# Patient Record
Sex: Male | Born: 2000 | Race: Black or African American | Hispanic: No | Marital: Single | Smoking: Never smoker
Health system: Southern US, Community
[De-identification: ages and names within clinical notes are randomized; demographics above are authoritative.]

## PROBLEM LIST (undated history)

## (undated) DIAGNOSIS — N200 Calculus of kidney: Secondary | ICD-10-CM

## (undated) DIAGNOSIS — F988 Other specified behavioral and emotional disorders with onset usually occurring in childhood and adolescence: Secondary | ICD-10-CM

---
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
## 2020-11-25 DIAGNOSIS — L301 Dyshidrosis [pompholyx]: Secondary | ICD-10-CM | POA: Insufficient documentation

## 2020-11-25 DIAGNOSIS — Z87442 Personal history of urinary calculi: Secondary | ICD-10-CM | POA: Insufficient documentation

## 2021-09-06 ENCOUNTER — Ambulatory Visit (INDEPENDENT_AMBULATORY_CARE_PROVIDER_SITE_OTHER): Payer: No Typology Code available for payment source | Admitting: Physician Assistant

## 2021-09-06 ENCOUNTER — Other Ambulatory Visit: Payer: Self-pay

## 2021-09-06 ENCOUNTER — Encounter: Payer: Self-pay | Admitting: Physician Assistant

## 2021-09-06 VITALS — BP 138/64 | HR 79 | Ht 72.5 in | Wt 186.0 lb

## 2021-09-06 DIAGNOSIS — R109 Unspecified abdominal pain: Secondary | ICD-10-CM

## 2021-09-06 DIAGNOSIS — Z7689 Persons encountering health services in other specified circumstances: Secondary | ICD-10-CM

## 2021-09-06 DIAGNOSIS — Z0000 Encounter for general adult medical examination without abnormal findings: Secondary | ICD-10-CM

## 2021-09-06 DIAGNOSIS — Z13220 Encounter for screening for lipoid disorders: Secondary | ICD-10-CM

## 2021-09-06 DIAGNOSIS — Z113 Encounter for screening for infections with a predominantly sexual mode of transmission: Secondary | ICD-10-CM

## 2021-09-06 DIAGNOSIS — Z1159 Encounter for screening for other viral diseases: Secondary | ICD-10-CM | POA: Diagnosis not present

## 2021-09-06 DIAGNOSIS — Z13228 Encounter for screening for other metabolic disorders: Secondary | ICD-10-CM

## 2021-09-06 DIAGNOSIS — Z114 Encounter for screening for human immunodeficiency virus [HIV]: Secondary | ICD-10-CM

## 2021-09-06 DIAGNOSIS — Z87442 Personal history of urinary calculi: Secondary | ICD-10-CM

## 2021-09-06 NOTE — Patient Instructions (Signed)
Made referral to urology Get labs  Health Maintenance, Male Adopting a healthy lifestyle and getting preventive care are important in promoting health and wellness. Ask your health care provider about: The right schedule for you to have regular tests and exams. Things you can do on your own to prevent diseases and keep yourself healthy. What should I know about diet, weight, and exercise? Eat a healthy diet  Eat a diet that includes plenty of vegetables, fruits, low-fat dairy products, and lean protein. Do not eat a lot of foods that are high in solid fats, added sugars, or sodium. Maintain a healthy weight Body mass index (BMI) is a measurement that can be used to identify possible weight problems. It estimates body fat based on height and weight. Your health care provider can help determine your BMI and help you achieve or maintain a healthy weight. Get regular exercise Get regular exercise. This is one of the most important things you can do for your health. Most adults should: Exercise for at least 150 minutes each week. The exercise should increase your heart rate and make you sweat (moderate-intensity exercise). Do strengthening exercises at least twice a week. This is in addition to the moderate-intensity exercise. Spend less time sitting. Even light physical activity can be beneficial. Watch cholesterol and blood lipids Have your blood tested for lipids and cholesterol at 21 years of age, then have this test every 5 years. You may need to have your cholesterol levels checked more often if: Your lipid or cholesterol levels are high. You are older than 21 years of age. You are at high risk for heart disease. What should I know about cancer screening? Many types of cancers can be detected early and may often be prevented. Depending on your health history and family history, you may need to have cancer screening at various ages. This may include screening for: Colorectal  cancer. Prostate cancer. Skin cancer. Lung cancer. What should I know about heart disease, diabetes, and high blood pressure? Blood pressure and heart disease High blood pressure causes heart disease and increases the risk of stroke. This is more likely to develop in people who have high blood pressure readings or are overweight. Talk with your health care provider about your target blood pressure readings. Have your blood pressure checked: Every 3-5 years if you are 80-4 years of age. Every year if you are 40 years old or older. If you are between the ages of 69 and 70 and are a current or former smoker, ask your health care provider if you should have a one-time screening for abdominal aortic aneurysm (AAA). Diabetes Have regular diabetes screenings. This checks your fasting blood sugar level. Have the screening done: Once every three years after age 53 if you are at a normal weight and have a low risk for diabetes. More often and at a younger age if you are overweight or have a high risk for diabetes. What should I know about preventing infection? Hepatitis B If you have a higher risk for hepatitis B, you should be screened for this virus. Talk with your health care provider to find out if you are at risk for hepatitis B infection. Hepatitis C Blood testing is recommended for: Everyone born from [birth year] through 1966. Anyone with known risk factors for hepatitis C. Sexually transmitted infections (STIs) You should be screened each year for STIs, including gonorrhea and chlamydia, if: You are sexually active and are younger than 21 years of age. You are older  than 21 years of age and your health care provider tells you that you are at risk for this type of infection. Your sexual activity has changed since you were last screened, and you are at increased risk for chlamydia or gonorrhea. Ask your health care provider if you are at risk. Ask your health care provider about whether you are at  high risk for HIV. Your health care provider may recommend a prescription medicine to help prevent HIV infection. If you choose to take medicine to prevent HIV, you should first get tested for HIV. You should then be tested every 3 months for as long as you are taking the medicine. Follow these instructions at home: Alcohol use Do not drink alcohol if your health care provider tells you not to drink. If you drink alcohol: Limit how much you have to 0-2 drinks a day. Know how much alcohol is in your drink. In the [place] one drink equals one 12 oz bottle of beer (355 mL), one 5 oz glass of wine (148 mL), or one 1 oz glass of hard liquor (44 mL). Lifestyle Do not use any products that contain nicotine or tobacco. These products include cigarettes, chewing tobacco, and vaping devices, such as e-cigarettes. If you need help quitting, ask your health care provider. Do not use street drugs. Do not share needles. Ask your health care provider for help if you need support or information about quitting drugs. General instructions Schedule regular health, dental, and eye exams. Stay current with your vaccines. Tell your health care provider if: You often feel depressed. You have ever been abused or do not feel safe at home. Summary Adopting a healthy lifestyle and getting preventive care are important in promoting health and wellness. Follow your health care provider's instructions about healthy diet, exercising, and getting tested or screened for diseases. Follow your health care provider's instructions on monitoring your cholesterol and blood pressure. This information is not intended to replace advice given to you by your health care provider. Make sure you discuss any questions you have with your health care provider. Document Revised: 02/13/2021 Document Reviewed: 02/13/2021 Elsevier Patient Education  2022 [employer].

## 2021-09-06 NOTE — Progress Notes (Signed)
   Subjective:    Patient ID: Kyle Dalton, male    DOB: 29-Oct-2000, 20 y.o.   MRN: [id number]  HPI    Review of Systems     Objective:   Physical Exam        Assessment & Plan:

## 2021-09-07 LAB — COMPLETE METABOLIC PANEL WITH GFR
AG Ratio: 1.4 (calc) (ref 1.0–2.5)
ALT: 14 U/L (ref 9–46)
AST: 18 U/L (ref 10–40)
Albumin: 4.9 g/dL (ref 3.6–5.1)
Alkaline phosphatase (APISO): 77 U/L (ref 36–130)
BUN: 10 mg/dL (ref 7–25)
CO2: 25 mmol/L (ref 20–32)
Calcium: 10.2 mg/dL (ref 8.6–10.3)
Chloride: 99 mmol/L (ref 98–110)
Creat: 0.85 mg/dL (ref 0.60–1.24)
Globulin: 3.6 g/dL (calc) (ref 1.9–3.7)
Glucose, Bld: 76 mg/dL (ref 65–99)
Potassium: 4 mmol/L (ref 3.5–5.3)
Sodium: 137 mmol/L (ref 135–146)
Total Bilirubin: 1.2 mg/dL (ref 0.2–1.2)
Total Protein: 8.5 g/dL — ABNORMAL HIGH (ref 6.1–8.1)
eGFR: 128 mL/min/{1.73_m2} (ref >=60)

## 2021-09-07 LAB — TSH: TSH: 2.57 mIU/L (ref 0.40–4.50)

## 2021-09-07 LAB — CBC WITH DIFFERENTIAL/PLATELET
Absolute Monocytes: 726 cells/uL (ref 200–950)
Basophils Absolute: 33 cells/uL (ref 0–200)
Basophils Relative: 0.5 %
Eosinophils Absolute: 33 cells/uL (ref 15–500)
Eosinophils Relative: 0.5 %
HCT: 47.2 % (ref 38.5–50.0)
Hemoglobin: 16 g/dL (ref 13.2–17.1)
Lymphs Abs: 1294 cells/uL (ref 850–3900)
MCH: 29.9 pg (ref 27.0–33.0)
MCHC: 33.9 g/dL (ref 32.0–36.0)
MCV: 88.1 fL (ref 80.0–100.0)
MPV: 11.4 fL (ref 7.5–12.5)
Monocytes Relative: 11 %
Neutro Abs: 4514 cells/uL (ref 1500–7800)
Neutrophils Relative %: 68.4 %
Platelets: 227 10*3/uL (ref 140–400)
RBC: 5.36 10*6/uL (ref 4.20–5.80)
RDW: 12.9 % (ref 11.0–15.0)
Total Lymphocyte: 19.6 %
WBC: 6.6 10*3/uL (ref 3.8–10.8)

## 2021-09-07 LAB — LIPID PANEL
Cholesterol: 220 mg/dL — ABNORMAL HIGH (ref <200)
HDL: 57 mg/dL (ref >=40)
LDL Cholesterol (Calc): 150 mg/dL (calc) — ABNORMAL HIGH
Non-HDL Cholesterol (Calc): 163 mg/dL (calc) — ABNORMAL HIGH (ref <130)
Total CHOL/HDL Ratio: 3.9 (calc) (ref <5.0)
Triglycerides: 44 mg/dL (ref <150)

## 2021-09-07 LAB — HIV ANTIBODY (ROUTINE TESTING W REFLEX): HIV 1&2 Ab, 4th Generation: NONREACTIVE

## 2021-09-07 LAB — RPR: RPR Ser Ql: NONREACTIVE

## 2021-09-07 LAB — HEPATITIS C ANTIBODY
Hepatitis C Ab: NONREACTIVE
SIGNAL TO CUT-OFF: 0.04 (ref <1.00)

## 2021-09-08 ENCOUNTER — Other Ambulatory Visit: Payer: Self-pay

## 2021-09-08 ENCOUNTER — Ambulatory Visit (INDEPENDENT_AMBULATORY_CARE_PROVIDER_SITE_OTHER): Payer: No Typology Code available for payment source | Admitting: Physician Assistant

## 2021-09-08 ENCOUNTER — Encounter: Payer: Self-pay | Admitting: Physician Assistant

## 2021-09-08 ENCOUNTER — Other Ambulatory Visit (HOSPITAL_BASED_OUTPATIENT_CLINIC_OR_DEPARTMENT_OTHER): Payer: Self-pay

## 2021-09-08 ENCOUNTER — Ambulatory Visit (INDEPENDENT_AMBULATORY_CARE_PROVIDER_SITE_OTHER): Payer: No Typology Code available for payment source

## 2021-09-08 VITALS — BP 147/67 | HR 102 | Temp 97.6°F | Ht 72.5 in | Wt 188.0 lb

## 2021-09-08 DIAGNOSIS — Z87442 Personal history of urinary calculi: Secondary | ICD-10-CM

## 2021-09-08 DIAGNOSIS — R319 Hematuria, unspecified: Secondary | ICD-10-CM

## 2021-09-08 DIAGNOSIS — E7800 Pure hypercholesterolemia, unspecified: Secondary | ICD-10-CM | POA: Insufficient documentation

## 2021-09-08 DIAGNOSIS — R1032 Left lower quadrant pain: Secondary | ICD-10-CM

## 2021-09-08 DIAGNOSIS — R109 Unspecified abdominal pain: Secondary | ICD-10-CM

## 2021-09-08 DIAGNOSIS — K769 Liver disease, unspecified: Secondary | ICD-10-CM | POA: Insufficient documentation

## 2021-09-08 LAB — POCT URINALYSIS DIP (CLINITEK)
Bilirubin, UA: NEGATIVE
Glucose, UA: NEGATIVE mg/dL
Leukocytes, UA: NEGATIVE
Nitrite, UA: NEGATIVE
POC PROTEIN,UA: NEGATIVE
Spec Grav, UA: <=1.005 — AB (ref 1.010–1.025)
Urobilinogen, UA: 0.2 E.U./dL
pH, UA: 5.5 (ref 5.0–8.0)

## 2021-09-08 MED ORDER — TAMSULOSIN HCL 0.4 MG PO CAPS
0.4000 mg | ORAL_CAPSULE | Freq: Every day | ORAL | 1 refills | Status: DC
  Filled 2021-09-08: qty 30, 30d supply, fill #0

## 2021-09-08 MED ORDER — ONDANSETRON 4 MG PO TBDP
ORAL_TABLET | ORAL | 1 refills | Status: DC
Start: 2021-09-08 — End: 2022-06-18
  Filled 2021-09-08: qty 20, 7d supply, fill #0

## 2021-09-08 MED ORDER — KETOROLAC TROMETHAMINE 60 MG/2ML IM SOLN
60.0000 mg | Freq: Once | INTRAMUSCULAR | Status: AC
  Administered 2021-09-08: 60 mg via INTRAMUSCULAR

## 2021-09-08 MED ORDER — HYDROCODONE-ACETAMINOPHEN 5-325 MG PO TABS
ORAL_TABLET | ORAL | 0 refills | Status: DC
  Filled 2021-09-08: qty 10, 3d supply, fill #0

## 2021-09-08 NOTE — Progress Notes (Signed)
Subjective:    Patient ID: Kyle Dalton, male    DOB: 05/05/2001, 19 y.o.   MRN: [id number]  HPI Pt is a 20 year old male with history of kidney stones who is having acute worsening of left flank pain into left lower abdomen.  He is unable to rate the pain but says it is pretty painful.  He has had kidney stones for many years intermittently.  He does report some radiation of pain into his left testicles.  He has not tried anything to make better.  He denies any fever, chills, body aches.  He is having some nausea.   .. 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     Objective:   Physical Exam Vitals reviewed.  Constitutional:      Appearance: He is well-developed.  HENT:     Head: Normocephalic.  Cardiovascular:     Rate and Rhythm: Normal rate.  Pulmonary:     Effort: Pulmonary effort is normal.  Abdominal:     General: Bowel sounds are increased. There is distension.     Palpations: Abdomen is soft.     Tenderness: There is abdominal tenderness in the left lower quadrant. There is left CVA tenderness. There is no right CVA tenderness, guarding or rebound. Negative signs include Murphy's sign and McBurney's sign.     Hernia: No hernia is present.  Neurological:     General: No focal deficit present.     Mental Status: He is alert.  Psychiatric:        Mood and Affect: Mood normal.      .. Results for orders placed or performed in visit on 09/08/21  POCT URINALYSIS DIP (CLINITEK)  Result Value Ref Range   Color, UA light yellow (A) yellow   Clarity, UA clear clear   Glucose,  UA negative negative mg/dL   Bilirubin, UA negative negative   Ketones, POC UA small (15) (A) negative mg/dL   Spec Grav, UA <=4.098<=1.005 (A) 1.010 - 1.025   Blood, UA large (A) negative   pH, UA 5.5 5.0 - 8.0   POC PROTEIN,UA negative negative, trace   Urobilinogen, UA 0.2 0.2 or 1.0 E.U./dL   Nitrite, UA Negative Negative   Leukocytes, UA Negative Negative       Assessment & Plan:  Kyle Dalton Kyle Dalton.Kyle Dalton Kyle Dalton.[name] was seen today for abdominal pain.  Diagnoses and all orders for this visit:  Left lower quadrant abdominal pain -     tamsulosin (FLOMAX) 0.4 MG CAPS capsule; Take 1 capsule (0.4 mg total) by mouth daily after supper. -     CT RENAL STONE STUDY; Future -     POCT URINALYSIS DIP (CLINITEK) -     Urine Culture -     ketorolac (TORADOL) injection 60 mg  Left flank pain -     tamsulosin (FLOMAX) 0.4 MG CAPS capsule; Take 1 capsule (0.4 mg total) by mouth daily after supper. -     CT RENAL STONE STUDY; Future -     POCT URINALYSIS DIP (CLINITEK) -     Urine Culture -  ketorolac (TORADOL) injection 60 mg  History of kidney stones -     tamsulosin (FLOMAX) 0.4 MG CAPS capsule; Take 1 capsule (0.4 mg total) by mouth daily after supper. -     CT RENAL STONE STUDY; Future -     POCT URINALYSIS DIP (CLINITEK) -     Urine Culture -     ketorolac (TORADOL) injection 60 mg  Hematuria, unspecified type -     tamsulosin (FLOMAX) 0.4 MG CAPS capsule; Take 1 capsule (0.4 mg total) by mouth daily after supper. -     CT RENAL STONE STUDY; Future -     POCT URINALYSIS DIP (CLINITEK) -     Urine Culture -     ketorolac (TORADOL) injection 60 mg  Referral made for urology yesterday. UA positive for blood.  Will culture.  STAT CT ordered to access and make sure he can pass stone.  Flomax started  Ibuprofen 800mg  TID. Declined pain rx Toradol 60mg  given in office Stay hydrated follow up as needed or if symptoms persist.

## 2021-09-08 NOTE — Patient Instructions (Addendum)
Ibuprofen to 800mg  up to three times a day during kidney stone pain. Start flomax daily.   Kidney Stones Kidney stones are solid, rock-like deposits that form inside of the kidneys. The kidneys are a pair of organs that make urine. A kidney stone may form in a kidney and move into other parts of the urinary tract, including the tubes that connect the kidneys to the bladder (ureters), the bladder, and the tube that carries urine out of the body (urethra). As the stone moves through these areas, it can cause intense pain and block the flow of urine. Kidney stones are created when high levels of certain minerals are found in the urine. The stones are usually passed out of the body through urination, but in some cases, medical treatment may be needed to remove them. What are the causes? Kidney stones may be caused by: A condition in which certain glands produce too much parathyroid hormone (primary hyperparathyroidism), which causes too much calcium buildup in the blood. A buildup of uric acid crystals in the bladder (hyperuricosuria). Uric acid is a chemical that the body produces when you eat certain foods. It usually exits the body in the urine. Narrowing (stricture) of one or both of the ureters. A kidney blockage that is present at birth (congenital obstruction). Past surgery on the kidney or the ureters, such as gastric bypass surgery. What increases the risk? The following factors may make you more likely to develop this condition: Having had a kidney stone in the past. Having a family history of kidney stones. Not drinking enough water. Eating a diet that is high in protein, salt (sodium), or sugar. Being overweight or obese. What are the signs or symptoms? Symptoms of a kidney stone may include: Pain in the side of the abdomen, right below the ribs (flank pain). Pain usually spreads (radiates) to the groin. Needing to urinate frequently or urgently. Painful urination. Blood in the urine  (hematuria). Nausea. Vomiting. Fever and chills. How is this diagnosed? This condition may be diagnosed based on: Your symptoms and medical history. A physical exam. Blood tests. Urine tests. These may be done before and after the stone passes out of your body through urination. Imaging tests, such as a CT scan, abdominal X-ray, or ultrasound. A procedure to examine the inside of the bladder (cystoscopy). How is this treated? Treatment for kidney stones depends on the size, location, and makeup of the stones. Kidney stones will often pass out of the body through urination. You may need to: Increase your fluid intake to help pass the stone. In some cases, you may be given fluids through an IV and may need to be monitored at the hospital. Take medicine for pain. Make changes in your diet to help prevent kidney stones from coming back. Sometimes, medical procedures are needed to remove a kidney stone. This may involve: A procedure to break up kidney stones using: A focused beam of light (laser therapy). Shock waves (extracorporeal shock wave lithotripsy). Surgery to remove kidney stones. This may be needed if you have severe pain or have stones that block your urinary tract. Follow these instructions at home: Medicines Take over-the-counter and prescription medicines only as told by your health care provider. Ask your health care provider if the medicine prescribed to you requires you to avoid driving or using heavy machinery. Eating and drinking Drink enough fluid to keep your urine pale yellow. You may be instructed to drink at least 8-10 glasses of water each day. This will  help you pass the kidney stone. If directed, change your diet. This may include: Limiting how much sodium you eat. Eating more fruits and vegetables. Limiting how much animal protein--such as red meat, poultry, fish, and eggs--you eat. Follow instructions from your health care provider about eating or drinking  restrictions. General instructions Collect urine samples as told by your health care provider. You may need to collect a urine sample: 24 hours after you pass the stone. 8-12 weeks after passing the kidney stone, and every 6-12 months after that. Strain your urine every time you urinate, for as long as directed. Use the strainer that your health care provider recommends. Do not throw out the kidney stone after passing it. Keep the stone so it can be tested by your health care provider. Testing the makeup of your kidney stone may help prevent you from getting kidney stones in the future. Keep all follow-up visits as told by your health care provider. This is important. You may need follow-up X-rays or ultrasounds to make sure that your stone has passed. How is this prevented? To prevent another kidney stone: Drink enough fluid to keep your urine pale yellow. This is the best way to prevent kidney stones. Eat a healthy diet and follow recommendations from your health care provider about foods to avoid. You may be instructed to eat a low-protein diet. Recommendations vary depending on the type of kidney stone that you have. Maintain a healthy weight. Where to find more information [facility]): www.kidney.[facility]): www.urologyhealth.org Contact a health care provider if: You have pain that gets worse or does not get better with medicine. Get help right away if: You have a fever or chills. You develop severe pain. You develop new abdominal pain. You faint. You are unable to urinate. Summary Kidney stones are solid, rock-like deposits that form inside of the kidneys. Kidney stones can cause nausea, vomiting, blood in the urine, abdominal pain, and the urge to urinate frequently. Treatment for kidney stones depends on the size, location, and makeup of the stones. Kidney stones will often pass out of the body through urination. Kidney stones can be  prevented by drinking enough fluids, eating a healthy diet, and maintaining a healthy weight. This information is not intended to replace advice given to you by your health care provider. Make sure you discuss any questions you have with your health care provider. Document Revised: 02/06/2019 Document Reviewed: 02/10/2019 Elsevier Patient Education  [place].

## 2021-09-08 NOTE — Progress Notes (Signed)
64mm left kidney stone with swelling of left kidney. Needs to see Urology ASAP likely not going to be able to pass this stone without medical intervention. We will call urology today to see about first available [email or web address] I need to change his urology referral to STAT.   Non acute lesion on liver. Likely cyst but will get ultrasound of it once your stone is treated.

## 2021-09-08 NOTE — Progress Notes (Signed)
Kyle Dalton,   STI screening negative. (Still need urine GC/chlamydia that was not collected to be complete) Kidney, liver, glucose looks good.  Total protein was just a tad elevated but not concerning.  Thyroid looks great.  Your LDL, bad cholesterol, is elevated. The goal LDL for primary prevention is under 100. Make sure to limit fried and processed fats. Try to get regular exercise. Right now no medication is needed but as you age your risk does change.  Your HDL, good cholesterol, looks great!

## 2021-09-08 NOTE — Progress Notes (Unsigned)
I called [facility] and sent them the CT results. They called patient and he is scheduled today. - CF

## 2021-09-08 NOTE — Progress Notes (Signed)
New Patient Office Visit  Subjective:  Patient ID: Kyle Dalton, male    DOB: 04-23-2001  Age: 20 y.o. MRN: [id number]  CC:  Chief Complaint  Patient presents with   Establish Care    HPI Derwood Beckstrand presents to establish care.   Pt would like referral to urology. Has hx of kidney stones and having intermittent bilateral flank pain. No blood in urine. No current urinary symptoms.   He does wish to have STD testing today and screening labs.     Social History   Socioeconomic History   Marital status: Single    Spouse name: Not on file   Number of children: Not on file   Years of education: Not on file   Highest education level: Not on file  Occupational History   Not on file  Tobacco Use   Smoking status: Never    Passive exposure: Never   Smokeless tobacco: Never  Substance and Sexual Activity   Alcohol use: Never   Drug use: Never   Sexual activity: Yes    Partners: Female    Birth control/protection: Condom  Other Topics Concern   Not on file  Social History Narrative   Not on file   Social Determinants of Health   Financial Resource Strain: Not on file  Food Insecurity: Not on file  Transportation Needs: Not on file  Physical Activity: Not on file  Stress: Not on file  Social Connections: Not on file  Intimate Partner Violence: Not on file    ROS Review of Systems  All other systems reviewed and are negative.  Objective:   Today's Vitals: BP 138/64   Pulse 79   Ht 6' 0.5" (1.842 m)   Wt 186 lb (84.4 kg)   SpO2 99%   BMI 24.88 kg/m   Physical Exam Vitals reviewed.  Constitutional:      Appearance: Normal appearance.  HENT:     Head: Normocephalic.  Neck:     Vascular: No carotid bruit.  Cardiovascular:     Rate and Rhythm: Normal rate and regular rhythm.     Pulses: Normal pulses.     Heart sounds: Normal heart sounds.  Pulmonary:     Effort: Pulmonary effort is normal.     Breath sounds: Normal breath sounds.  Abdominal:      General: Bowel sounds are normal. There is no distension.     Palpations: Abdomen is soft.     Tenderness: There is no abdominal tenderness.  Lymphadenopathy:     Cervical: No cervical adenopathy.  Neurological:     General: No focal deficit present.     Mental Status: He is alert and oriented to person, place, and time.  Psychiatric:        Mood and Affect: Mood normal.     Assessment & Plan:  Marland KitchenMarland KitchenDarius was seen today for establish care.  Diagnoses and all orders for this visit:  Encounter to establish care -     CBC with Differential/Platelet -     COMPLETE METABOLIC PANEL WITH GFR -     Lipid panel -     TSH -     Hepatitis C antibody -     HIV Antibody (routine testing w rflx) -     RPR  Healthcare maintenance -     CBC with Differential/Platelet -     COMPLETE METABOLIC PANEL WITH GFR  Screening for lipid disorders -     Lipid panel  Screening for metabolic  disorder -     TSH  Need for hepatitis C screening test -     Hepatitis C antibody  Screening for HIV (human immunodeficiency virus) -     HIV Antibody (routine testing w rflx)  History of kidney stones -     Ambulatory referral to Urology  Screening examination for STD (sexually transmitted disease) -     RPR -     RPR  Bilateral flank pain -     Ambulatory referral to Urology  Referral made to urology for management.  Pt left without urine today. Called for him to come back to look for hematuria.   STI screening ordered and screening labs.  Discussed overall health.  Encouraged regular exercise.    Follow-up: Return in about 1 year (around 09/06/2022), or if symptoms worsen or fail to improve.   Tandy Gaw, PA-C

## 2021-09-10 LAB — URINE CULTURE
MICRO NUMBER:: 12710004
Result:: NO GROWTH
SPECIMEN QUALITY:: ADEQUATE

## 2021-09-11 NOTE — Progress Notes (Signed)
No bacteria in urine. Does patient have appt with urology??

## 2021-09-12 NOTE — Progress Notes (Signed)
Thank you. I could not see it in the EMR!

## 2021-10-19 ENCOUNTER — Other Ambulatory Visit: Payer: Self-pay | Admitting: Neurology

## 2021-10-19 DIAGNOSIS — Z113 Encounter for screening for infections with a predominantly sexual mode of transmission: Secondary | ICD-10-CM

## 2021-10-20 LAB — C. TRACHOMATIS/N. GONORRHOEAE RNA
C. trachomatis RNA, TMA: NOT DETECTED
N. gonorrhoeae RNA, TMA: NOT DETECTED

## 2021-10-20 NOTE — Progress Notes (Signed)
Negative for STD.

## 2022-06-18 ENCOUNTER — Other Ambulatory Visit (HOSPITAL_BASED_OUTPATIENT_CLINIC_OR_DEPARTMENT_OTHER): Payer: Self-pay

## 2022-06-18 ENCOUNTER — Ambulatory Visit
Admission: EM | Admit: 2022-06-18 | Discharge: 2022-06-18 | Disposition: A | Discharge status: Home / Self Care | Payer: No Typology Code available for payment source | Attending: Emergency Medicine | Admitting: Emergency Medicine

## 2022-06-18 DIAGNOSIS — H1032 Unspecified acute conjunctivitis, left eye: Secondary | ICD-10-CM | POA: Diagnosis not present

## 2022-06-18 DIAGNOSIS — H66002 Acute suppurative otitis media without spontaneous rupture of ear drum, left ear: Secondary | ICD-10-CM

## 2022-06-18 MED ORDER — POLYMYXIN B-TRIMETHOPRIM 10000-0.1 UNIT/ML-% OP SOLN
2.0000 [drp] | Freq: Four times a day (QID) | OPHTHALMIC | 0 refills | Status: AC
  Filled 2022-06-18: qty 10, 25d supply, fill #0

## 2022-06-18 MED ORDER — AMOXICILLIN-POT CLAVULANATE 875-125 MG PO TABS
1.0000 | ORAL_TABLET | Freq: Two times a day (BID) | ORAL | 0 refills | Status: AC
  Filled 2022-06-18: qty 14, 7d supply, fill #0

## 2022-06-18 NOTE — Discharge Instructions (Addendum)
Continue warm cool compresses, saline.  You can try Systane eyedrops.  Finish the antibiotic drops and oral antibiotics, even if you feel better.

## 2022-06-18 NOTE — ED Provider Notes (Signed)
HPI  SUBJECTIVE:  Kyle Dalton is a 21 y.o. male who presents with 2 days of left sided "pinkeye".  He reports increased tearing, burning pain starting today.  He is reports eyelid swelling and states that his eye was matted shut this morning.  He denies eye pain, visual changes, headache, fever, rhinorrhea, sneezing, itchy eyes, photophobia.  He reports nasal congestion and mild pain with extraocular movements.  He has tried hot and cold compresses and saline irrigation.  The saline helps.  No aggravating factors.  He does not wear contacts or glasses.  No contacts with pinkeye.  Reports ear pain with blowing nose only.  He has a past medical history of allergies and nephrolithiasis.  PCP: [facility].  Ophthalmology: None.    Past Medical History:  Diagnosis Date   ADD (attention deficit disorder)    Kidney stone     History reviewed. No pertinent surgical history.  History reviewed. No pertinent family history.  Social History   Tobacco Use   Smoking status: Never    Passive exposure: Never   Smokeless tobacco: Never  Substance Use Topics   Alcohol use: Never   Drug use: Never    No current facility-administered medications for this encounter.  Current Outpatient Medications:    amoxicillin-clavulanate (AUGMENTIN) 875-125 MG tablet, Take 1 tablet by mouth every 12 (twelve) hours., Disp: 14 tablet, Rfl: 0   trimethoprim-polymyxin b (POLYTRIM) ophthalmic solution, Place 2 drops into the left eye every 6 (six) hours., Disp: 10 mL, Rfl: 0   lisdexamfetamine (VYVANSE) 40 MG capsule, Take 40 mg by mouth every morning., Disp: , Rfl:   No Known Allergies   ROS  As noted in HPI.   Physical Exam  BP 107/71   Pulse 92   Temp 98.4 F (36.9 C)   Resp 16   SpO2 94%   Constitutional: Well developed, well nourished, no acute distress Eyes:  EOMI, PERRLA, no pain with EOMs.  Positive left conjunctival injection with mild chemosis.  No direct or consensual  photophobia.  Mild upper nontender eyelids swelling.  No periorbital erythema, edema, tenderness.  No foreign body seen on lid eversion.  No hyphema.  No corneal abrasion seen on flourescin exam.       Visual acuity: Left 20/10 Right 20/10  HENT: Normocephalic, atraumatic,mucus membranes moist.  Left TM, erythematous, dull.  Right TM normal. Respiratory: Normal inspiratory effort Cardiovascular: Normal rate GI: nondistended skin: No rash, skin intact Musculoskeletal: no deformities Neurologic: Alert & oriented x 3, no focal neuro deficits Psychiatric: Speech and behavior appropriate   ED Course   Medications - No data to display  Orders Placed This Encounter  Procedures   Visual acuity screening    Standing Status:   Standing    Number of Occurrences:   1    No results found for this or any previous visit (from the past 24 hour(s)). No results found.  ED Clinical Impression  1. Acute bacterial conjunctivitis of left eye   2. Non-recurrent acute suppurative otitis media of left ear without spontaneous rupture of tympanic membrane      ED Assessment/Plan     Patient with a conjunctivitis and otitis.  He states that he was having eye issues before his ear started bothering him.  home with Augmentin for 7 days, Polytrim, continue saline warm/cool compresses.  Follow-up with PCP or with ophthalmology as needed.  Discussed MDM, treatment plan, and plan for follow-up with patient. patient agrees with plan.  Meds ordered this encounter  Medications   amoxicillin-clavulanate (AUGMENTIN) 875-125 MG tablet    Sig: Take 1 tablet by mouth every 12 (twelve) hours.    Dispense:  14 tablet    Refill:  0   trimethoprim-polymyxin b (POLYTRIM) ophthalmic solution    Sig: Place 2 drops into the left eye every 6 (six) hours.    Dispense:  10 mL    Refill:  0      *This clinic note was created using Scientist, clinical (histocompatibility and immunogenetics). Therefore, there may be occasional mistakes  despite careful proofreading.  ?    Domenick Gong, MD 06/19/22 [id number]

## 2022-06-18 NOTE — ED Triage Notes (Addendum)
Pt. States that he think he may have pink eye in his left eye. Pt. States for the last two days his eye had been discolored, swollen and draining fluid. Has treated himself w/ saline.

## 2022-09-07 ENCOUNTER — Encounter: Payer: No Typology Code available for payment source | Admitting: Physician Assistant

## 2023-02-10 IMAGING — CT CT RENAL STONE PROTOCOL
2 of 4 series · 16 of 46 positions shown, 18 images · non-contrast
Comparison: None.

CLINICAL DATA: Flank pain

EXAM:
CT ABDOMEN AND PELVIS WITHOUT CONTRAST
TECHNIQUE: Multidetector CT imaging of the abdomen and pelvis was performed
following the standard protocol without IV contrast.

[Series 2: axial st · axial · 0.75mm/px · z∈[-541,-96]mm · 13 of 99 slices shown, 15 images]
[im 5/99  soft-tissue]
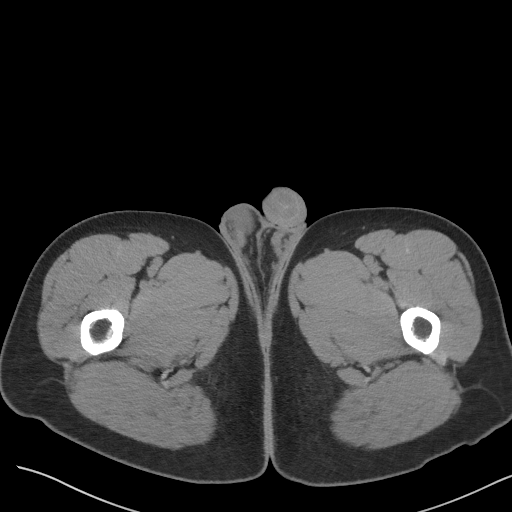
[im 5/99  bone]
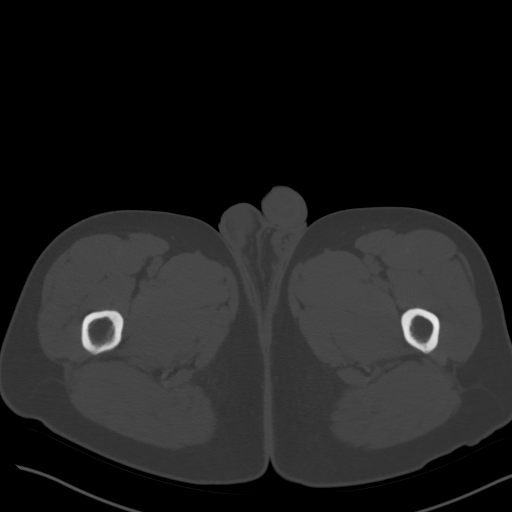
[im 13/99  soft-tissue]
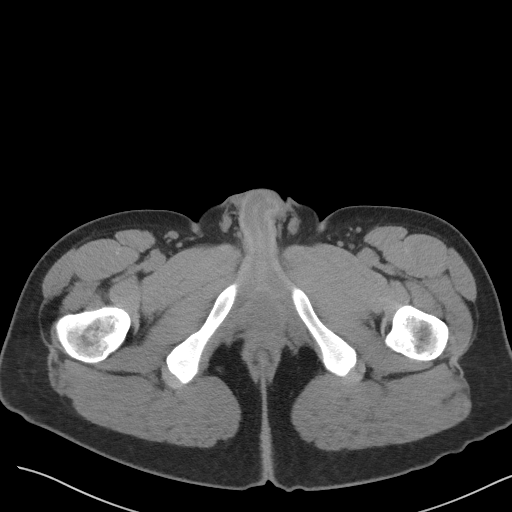
[im 21/99  soft-tissue]
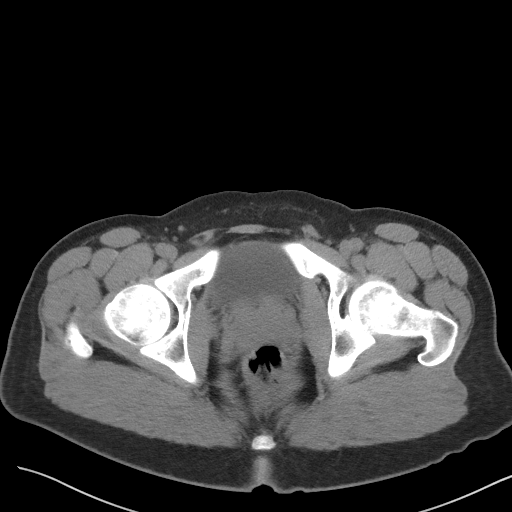
[im 29/99  soft-tissue]
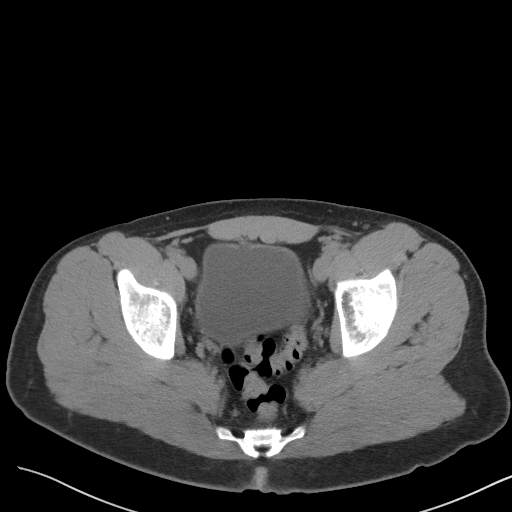
[im 33/99  soft-tissue]
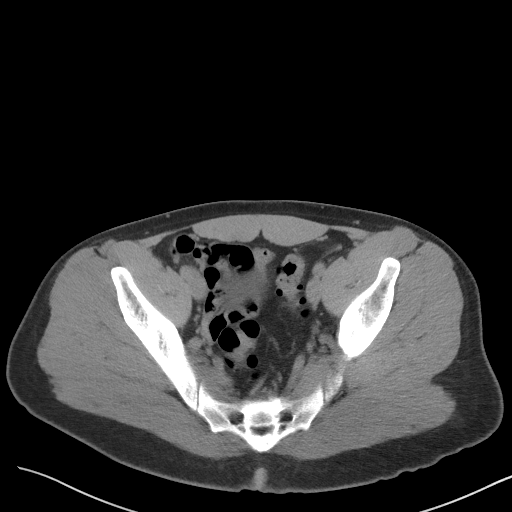
[im 41/99  soft-tissue]
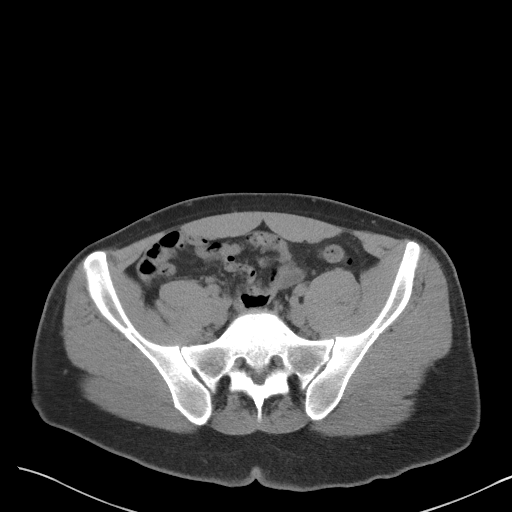
[im 50/99  soft-tissue]
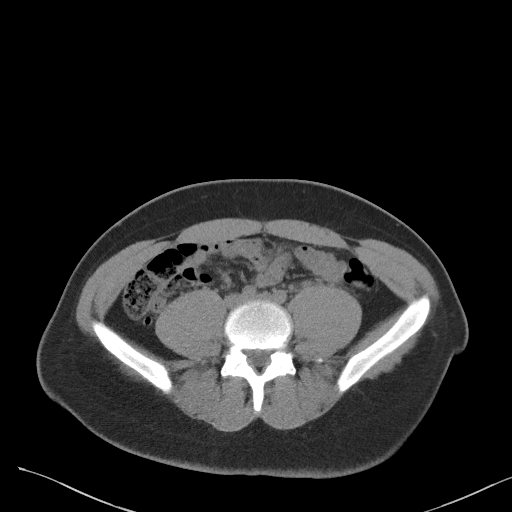
[im 58/99  soft-tissue]
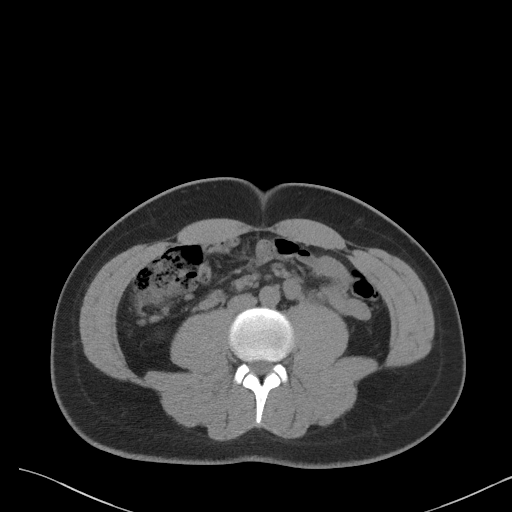
[im 66/99  soft-tissue]
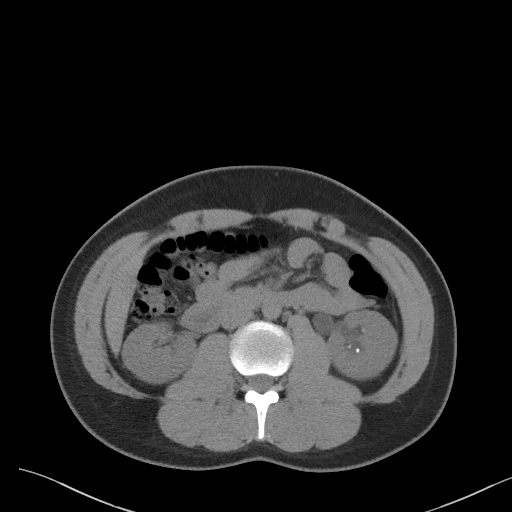
[im 66/99  bone]
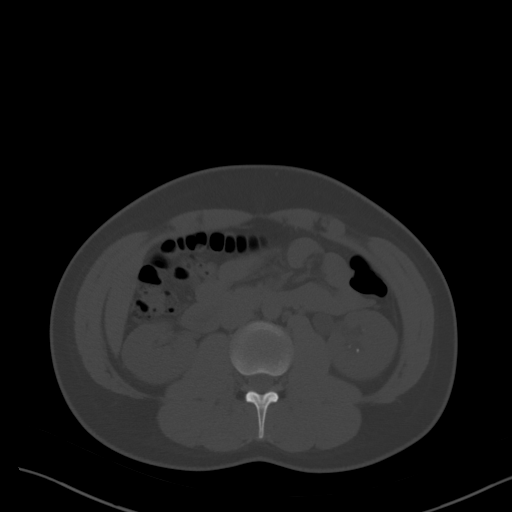
[im 70/99  soft-tissue]
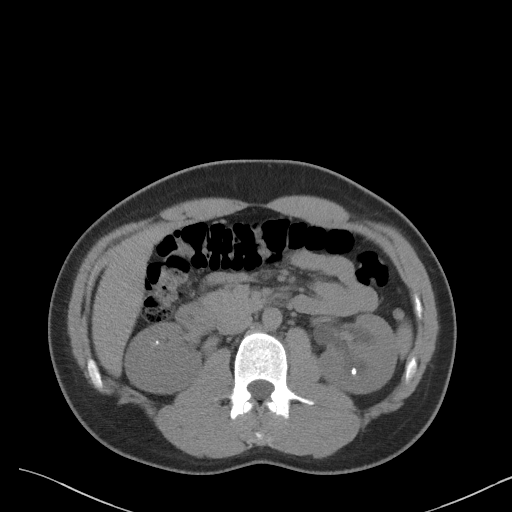
[im 78/99  soft-tissue]
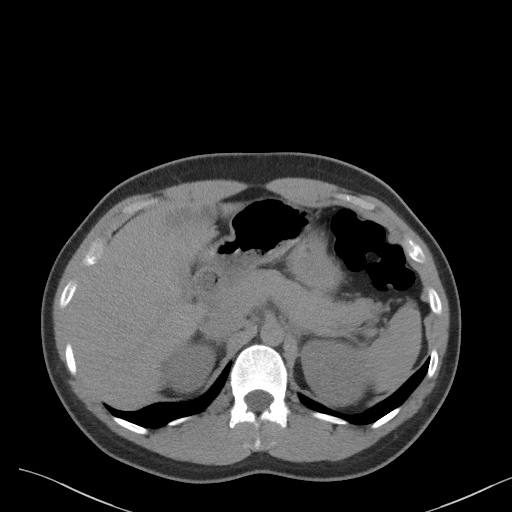
[im 86/99  soft-tissue]
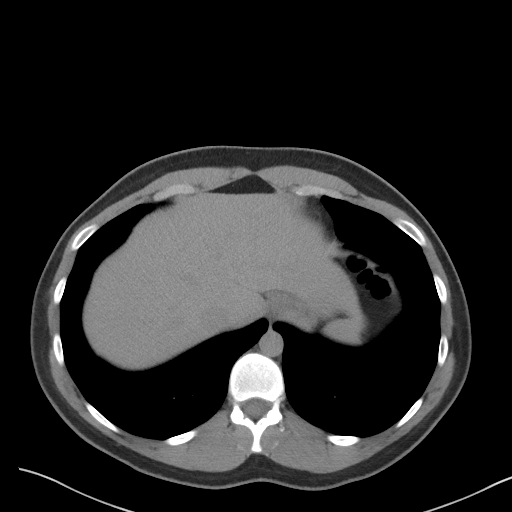
[im 94/99  soft-tissue]
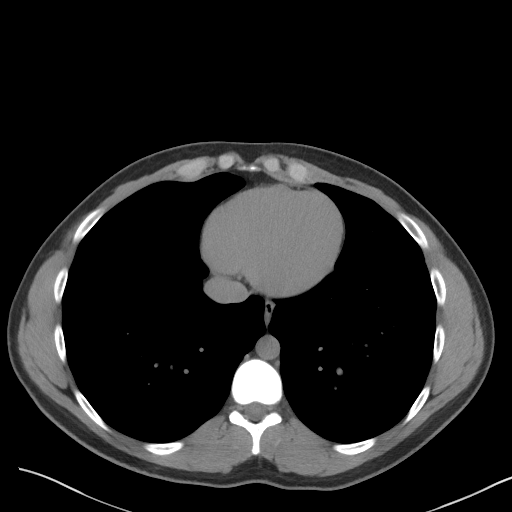

[Series 5: coronal st · coronal · 0.78mm/px · 3 of 101 slices shown]
[im 34/101  soft-tissue]
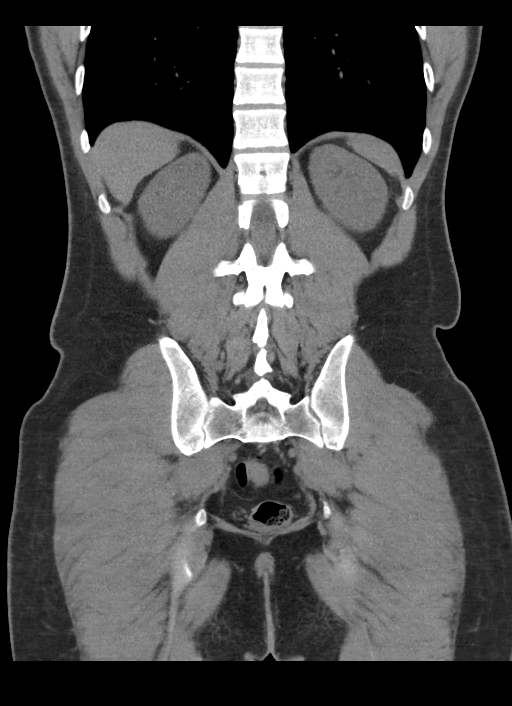
[im 45/101  soft-tissue]
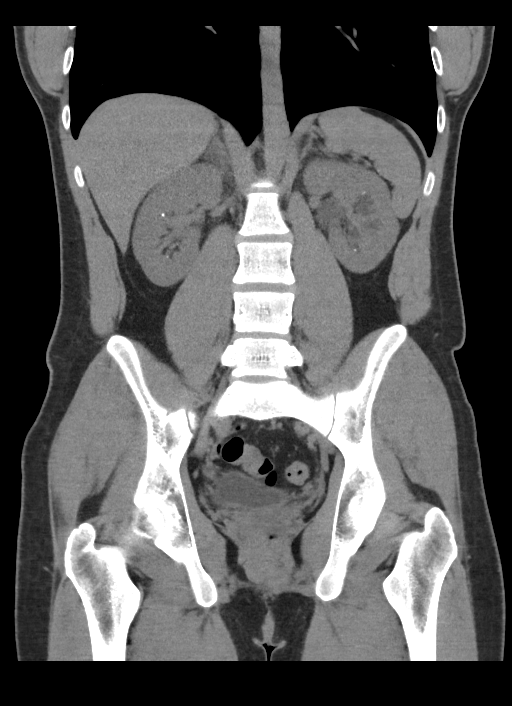
[im 56/101  soft-tissue]
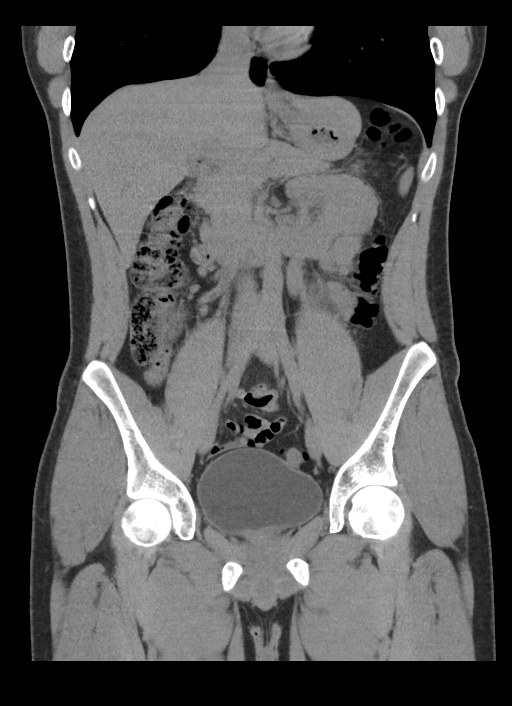

[16 of 46 positions shown; findings below may reference images not displayed]

FINDINGS: Lower chest: No acute abnormality.

Hepatobiliary: Liver is normal in size and contour. Mildly hypodense
lesion in the anterior liver segment 4B measuring 1.3 x 4.4 x
cm. Gallbladder appears grossly normal. No biliary ductal dilatation
identified.

Pancreas: Unremarkable. No pancreatic ductal dilatation or
surrounding inflammatory changes.

Spleen: Normal in size without focal abnormality.

Adrenals/Urinary Tract: Adrenal glands appear normal. Cluster of
several calculi identified in the mid left ureter which measure up
to 7 mm in size. Mild to moderate left hydroureteronephrosis.
Multiple nonobstructing renal calculi identified bilaterally which
measure up to 5 mm in the upper pole on the right and 5 mm in the
mid left kidney. Urinary bladder appears normal.

Stomach/Bowel: Stomach is within normal limits. Appendix appears
normal. No evidence of bowel wall thickening, distention, or
inflammatory changes.

Vascular/Lymphatic: No significant vascular findings are present. No
enlarged abdominal or pelvic lymph nodes.

Reproductive: Prostate is unremarkable.

Other: No abdominal wall hernia or abnormality. No abdominopelvic
ascites.

Musculoskeletal: No acute or significant osseous findings.
IMPRESSION: 1. Obstructive uropathy on the left. Cluster of several calculi in
the mid left ureter measuring up to 7 mm in size causing
mild-to-moderate left hydroureteronephrosis.
2. Bilateral nephrolithiasis.
3. Indeterminate hypodensity in the anterior liver which may
represent a cyst, recommend nonemergent follow-up ultrasound.

## 2024-02-21 DIAGNOSIS — R7989 Other specified abnormal findings of blood chemistry: Secondary | ICD-10-CM | POA: Insufficient documentation

## 2024-10-09 ENCOUNTER — Ambulatory Visit: Payer: Self-pay

## 2024-10-09 NOTE — Telephone Encounter (Signed)
 FYI Only or Action Required?: FYI only for provider: appointment scheduled on 10/15/24.  Patient was last seen in primary care on Not yet established.  Called Nurse Triage reporting Dizziness.  Symptoms began several weeks ago.  Interventions attempted: Other: Holter monitor with [facility].  Symptoms are: unchanged.  Triage Disposition: See PCP When Office is Open (Within 3 Days)  Patient/caregiver understands and will follow disposition?: Yes Reason for Disposition  [1] MODERATE dizziness (e.g., interferes with normal activities) AND [2] has been evaluated by doctor (or NP/PA) for this  Answer Assessment - Initial Assessment Questions Patient currently with [facility] and wants to establish care with [facility]. Denies new symptoms since being seen previously. Patient has a heart monitor on currently being monitored live with [facility]. Went over ED precautions with patient, verbalized understanding. Set up first new patient appointment at [facility] outside of dispo  1. DESCRIPTION: Describe your dizziness.     Lightheaded, faint and woozy several times through out the day  2. LIGHTHEADED: Do you feel lightheaded? (e.g., somewhat faint, woozy, weak upon standing)     Yes  3. VERTIGO: Do you feel like either you or the room is spinning or tilting? (i.e., vertigo)     Denies  4. SEVERITY: How bad is it?  Do you feel like you are going to faint? Can you stand and walk?     Sometimes, but able to walk around normally. Walked 5 miles yesterday with no issues  5. ONSET:  When did the dizziness begin?     09/11/24, seen in ED  6. HEART RATE: Can you tell me your heart rate? How many beats in 15 seconds?  (Note: Not all patients can do this.)       Unable to assess  7. CAUSE: What do you think is causing the dizziness? (e.g., decreased fluids or food, diarrhea, emotional distress, heat exposure, new medicine, sudden standing, vomiting; unknown)     Unsure, had low potassium  when seen in ED   8. RECURRENT SYMPTOM: Have you had dizziness before? If Yes, ask: When was the last time? What happened that time?     Denies  9. OTHER SYMPTOMS: Do you have any other symptoms? (e.g., fever, chest pain, vomiting, diarrhea, bleeding)       Heart palpitations, tachycardia, described it as veins fluttering sometimes, feeling out of it. Bouts of SOB but will pass.  Protocols used: Dizziness - Lightheadedness-A-AH  Copied from CRM [id number]. Topic: Clinical - Red Word Triage >> Oct 09, 2024 11:50 AM Harlene ORN wrote: Red Word that prompted transfer to Nurse Triage: was recently in urgent care for low potassium was released 12/06 Since then, still having sharp heart palpitations, dizziness, trouble breathing, been having all this happeneing since 12/05. Would like to [facility] Care to  [facility]

## 2024-10-15 ENCOUNTER — Ambulatory Visit: Admitting: Sports Medicine
